# Patient Record
Sex: Female | Born: 1975 | Race: Black or African American | Hispanic: No | Marital: Single | Smoking: Never smoker
Health system: Southern US, Community
[De-identification: ages and names within clinical notes are randomized; demographics above are authoritative.]

## PROBLEM LIST (undated history)

## (undated) DIAGNOSIS — I10 Essential (primary) hypertension: Secondary | ICD-10-CM

---

## 1997-11-04 ENCOUNTER — Emergency Department (HOSPITAL_COMMUNITY): Admission: EM | Admit: 1997-11-04 | Discharge: 1997-11-04 | Payer: Self-pay | Admitting: Emergency Medicine

## 2010-10-19 ENCOUNTER — Emergency Department (INDEPENDENT_AMBULATORY_CARE_PROVIDER_SITE_OTHER): Payer: Self-pay

## 2010-10-19 ENCOUNTER — Emergency Department (HOSPITAL_BASED_OUTPATIENT_CLINIC_OR_DEPARTMENT_OTHER)
Admission: EM | Admit: 2010-10-19 | Discharge: 2010-10-19 | Disposition: A | Discharge status: Home / Self Care | Payer: Self-pay | Attending: Emergency Medicine | Admitting: Emergency Medicine

## 2010-10-19 DIAGNOSIS — I10 Essential (primary) hypertension: Secondary | ICD-10-CM | POA: Insufficient documentation

## 2010-10-19 DIAGNOSIS — R51 Headache: Secondary | ICD-10-CM

## 2010-10-19 DIAGNOSIS — R509 Fever, unspecified: Secondary | ICD-10-CM

## 2010-10-19 LAB — URINALYSIS, ROUTINE W REFLEX MICROSCOPIC
Glucose, UA: NEGATIVE mg/dL
Hgb urine dipstick: NEGATIVE
Ketones, ur: NEGATIVE mg/dL
pH: 5.5 (ref 5.0–8.0)

## 2010-10-19 LAB — BASIC METABOLIC PANEL
CO2: 25 mEq/L (ref 19–32)
Chloride: 104 mEq/L (ref 96–112)
GFR calc Af Amer: >60 mL/min (ref >60)
Potassium: 3.9 mEq/L (ref 3.5–5.1)

## 2011-01-04 ENCOUNTER — Encounter: Payer: Self-pay | Admitting: Emergency Medicine

## 2011-01-04 ENCOUNTER — Emergency Department (HOSPITAL_BASED_OUTPATIENT_CLINIC_OR_DEPARTMENT_OTHER)
Admission: EM | Admit: 2011-01-04 | Discharge: 2011-01-04 | Disposition: A | Discharge status: Home / Self Care | Payer: Self-pay | Attending: Emergency Medicine | Admitting: Emergency Medicine

## 2011-01-04 DIAGNOSIS — R509 Fever, unspecified: Secondary | ICD-10-CM | POA: Insufficient documentation

## 2011-01-04 DIAGNOSIS — J029 Acute pharyngitis, unspecified: Secondary | ICD-10-CM | POA: Insufficient documentation

## 2011-01-04 DIAGNOSIS — J45909 Unspecified asthma, uncomplicated: Secondary | ICD-10-CM | POA: Insufficient documentation

## 2011-01-04 DIAGNOSIS — R52 Pain, unspecified: Secondary | ICD-10-CM | POA: Insufficient documentation

## 2011-01-04 DIAGNOSIS — I10 Essential (primary) hypertension: Secondary | ICD-10-CM | POA: Insufficient documentation

## 2011-01-04 HISTORY — DX: Essential (primary) hypertension: I10

## 2011-01-04 LAB — URINALYSIS, ROUTINE W REFLEX MICROSCOPIC
Leukocytes, UA: NEGATIVE
Nitrite: NEGATIVE
Specific Gravity, Urine: 1.02 (ref 1.005–1.030)
pH: 7 (ref 5.0–8.0)

## 2011-01-04 LAB — PREGNANCY, URINE: Preg Test, Ur: NEGATIVE

## 2011-01-04 LAB — RAPID STREP SCREEN (MED CTR MEBANE ONLY): Streptococcus, Group A Screen (Direct): NEGATIVE

## 2011-01-04 MED ORDER — PENICILLIN G BENZATHINE 1200000 UNIT/2ML IM SUSP
1.2000 10*6.[IU] | Freq: Once | INTRAMUSCULAR | Status: AC
  Administered 2011-01-04: 1.2 10*6.[IU] via INTRAMUSCULAR

## 2011-01-04 MED ORDER — PENICILLIN G BENZATHINE 1200000 UNIT/2ML IM SUSP
INTRAMUSCULAR | Status: AC
  Filled 2011-01-04: qty 2

## 2011-01-04 MED ORDER — KETOROLAC TROMETHAMINE 30 MG/ML IJ SOLN
30.0000 mg | Freq: Once | INTRAMUSCULAR | Status: AC
  Administered 2011-01-04: 30 mg via INTRAMUSCULAR
  Filled 2011-01-04: qty 1

## 2011-01-04 MED ORDER — IBUPROFEN 800 MG PO TABS: 800.0000 mg | ORAL_TABLET | Freq: Three times a day (TID) | ORAL | Status: AC

## 2011-01-04 MED ORDER — OXYCODONE-ACETAMINOPHEN 5-325 MG PO TABS: 1.0000 | ORAL_TABLET | Freq: Once | ORAL | Status: DC

## 2011-01-04 NOTE — ED Notes (Signed)
Pt c/o flu-like symptoms including sore throat, chills, body aches.

## 2011-01-04 NOTE — ED Provider Notes (Signed)
History     Chief Complaint  Patient presents with  . Sore Throat  . Generalized Body Aches  . Chills   HPI Pt presents with c/o sore throat, diffuse body aches and low grade fever.  Symptoms began 2 days ago.  Has tried OTC cold medications without much relief.  No dificulty breathing or swallowing. No cough, no nasal congestion History of Present Illness   Patient Identification Jamie Meyer is a 35 y.o. female.  Patient information was obtained from patient. History/Exam limitations: none. Patient presented to the Emergency Department ambulatory.  Chief Complaint  Sore Throat, Generalized Body Aches and Chills   The patient complains of sore throat. Onset of symptoms was gradual starting a few days ago and has been gradually worsening since that time. The patient has not been seen by another provider for this illness. Other symptoms include no nasal congestion, swollen glands, fever or cough or swollen glands. The patient has not had a known Strep exposure. The patient has not had a known Mono exposure. Care prior to arrival consisted of nothing, with no relief.  Past Medical History  Diagnosis Date  . Asthma   . Hypertension    No family history on file. No current facility-administered medications for this encounter.   Current Outpatient Prescriptions  Medication Sig Dispense Refill  . hydrochlorothiazide 25 MG tablet Take by mouth daily.        . nebivolol (BYSTOLIC) 10 MG tablet Take by mouth daily.        Marland Kitchen ibuprofen (ADVIL,MOTRIN) 800 MG tablet Take 1 tablet (800 mg total) by mouth 3 (three) times daily.  21 tablet  0   Allergies  Allergen Reactions  . Sulfa Drugs Cross Reactors    History   Social History  . Marital Status: Single    Spouse Name: N/A    Number of Children: N/A  . Years of Education: N/A   Occupational History  . Not on file.   Social History Main Topics  . Smoking status: Never Smoker   . Smokeless tobacco: Not on file  . Alcohol  Use: Yes  . Drug Use: No  . Sexually Active:    Other Topics Concern  . Not on file   Social History Narrative  . No narrative on file   Review of Systems Pertinent items are noted in HPI.  10 Systems reviewed and are negative for acute change except as noted in the HPI. Physical Exam   BP 140/89  Pulse 89  Temp(Src) 99.8 F (37.7 C) (Oral)  Resp 18  SpO2 99%  LMP 12/21/2010 BP 140/89  Pulse 89  Temp(Src) 99.8 F (37.7 C) (Oral)  Resp 18  SpO2 99%  LMP 12/21/2010 General appearance: alert and cooperative Head: Normocephalic, without obvious abnormality, atraumatic Nose: Nares normal. Septum midline. Mucosa normal. No drainage or sinus tenderness. Throat: lips, mucosa, and tongue normal; teeth and gums normal, OP with moderate erythema,  Lungs: clear to auscultation bilaterally Heart: regular rate and rhythm, S1, S2 normal, no murmur, click, rub or gallop Abdomen: soft, non-tender; bowel sounds normal; no masses,  no organomegaly Extremities: extremities normal, atraumatic, no cyanosis or edema Pulses: 2+ and symmetric Skin: Skin color, texture, turgor normal. No rashes or lesions Lymph nodes: mild anterior cervical lymphadenopathy  ED Course   Studies: Lab: rapid strep screen negative  Records Reviewed: Nursing notes.  Treatments: Antibiotics given.  Disposition: Home Nonsteroidals, pt treated with bicillin IM due to high centor criteria  Past Medical  History  Diagnosis Date  . Asthma   . Hypertension     No past surgical history on file.  No family history on file.  History  Substance Use Topics  . Smoking status: Never Smoker   . Smokeless tobacco: Not on file  . Alcohol Use: Yes    OB History    Grav Para Term Preterm Abortions TAB SAB Ect Mult Living                  Review of SystemsROS reviewed and otherwise negative except for mentioned in HPI   Physical Exam  BP 144/96  Pulse 103  Temp(Src) 99.8 F (37.7 C) (Oral)  Resp 18   SpO2 100%  LMP 12/21/2010  Physical Exam  Vitals reviewed.   ED Course  Procedures Pt with rapid strep negative, however does have centor criteria of 3, will treat with bicillin.  Pt agreeable with this plan.   MDM Fever, myalgias, pharyngitis.  Pt treated for strep pharyngitis, and symptomatic care.

## 2013-01-28 IMAGING — CT CT HEAD W/O CM
1 series · 16 of 30 positions shown, 20 images · non-contrast
Comparison: None.

CLINICAL DATA: Pain, headache, fever

CT HEAD WITHOUT CONTRAST
TECHNIQUE: Contiguous axial images were obtained from the base of
the skull through the vertex without contrast.

[Series 2: head 4.8 h37s · axial · 0.45mm/px · z∈[+1239,+1386]mm · 16 of 35 slices shown, 20 images]
[im 2/35  brain]
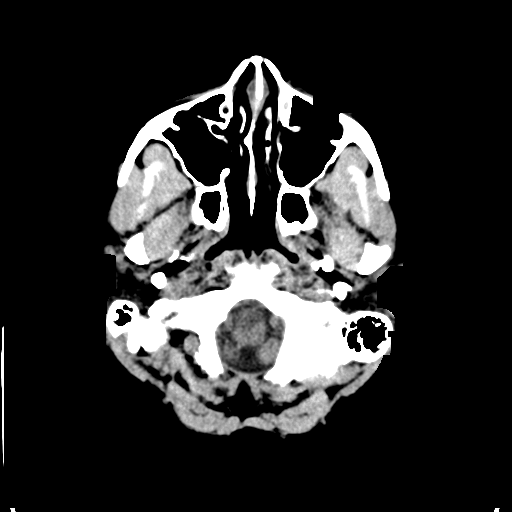
[im 2/35  bone]
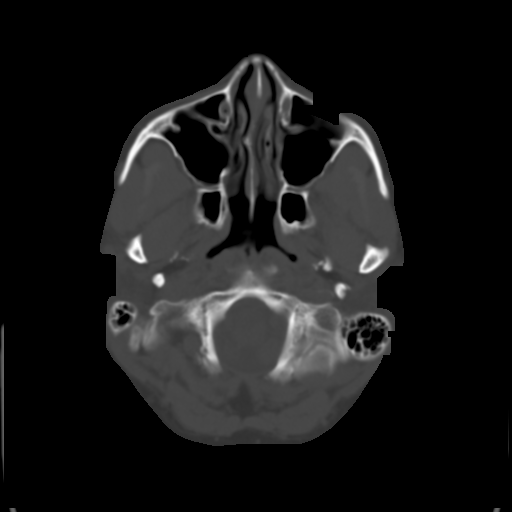
[im 4/35  brain]
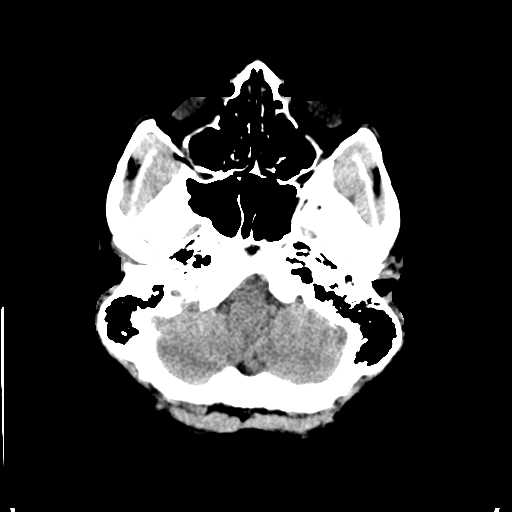
[im 6/35  brain]
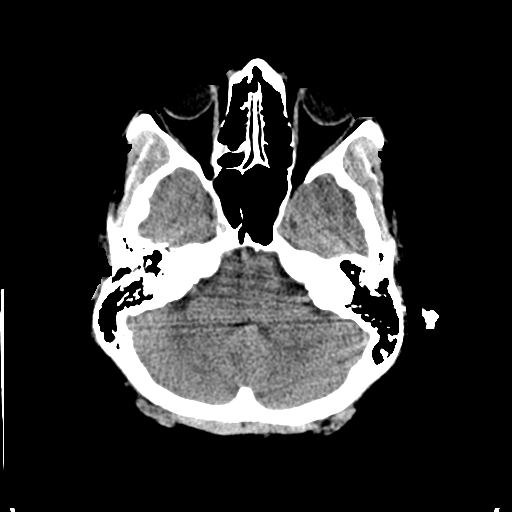
[im 9/35  brain]
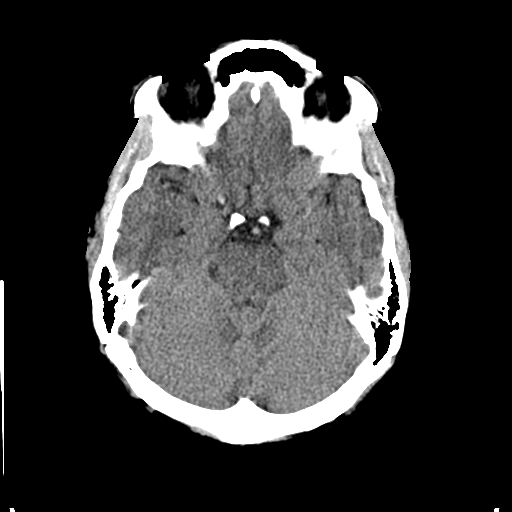
[im 10/35  brain]
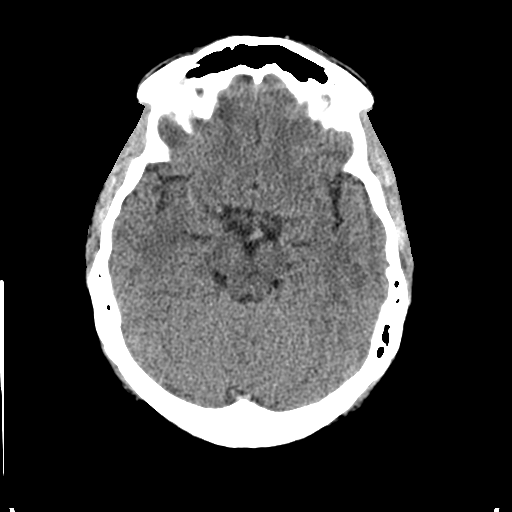
[im 10/35  bone]
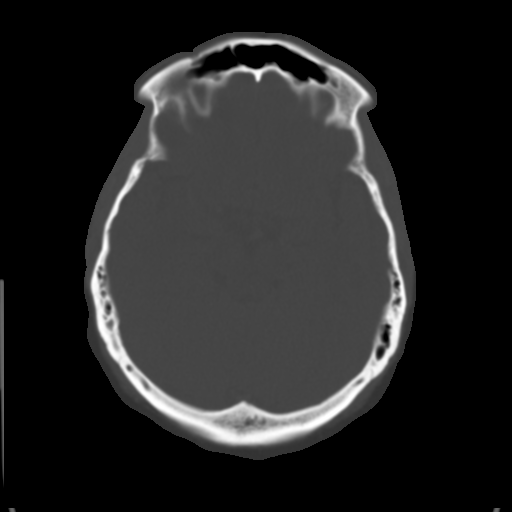
[im 12/35  brain]
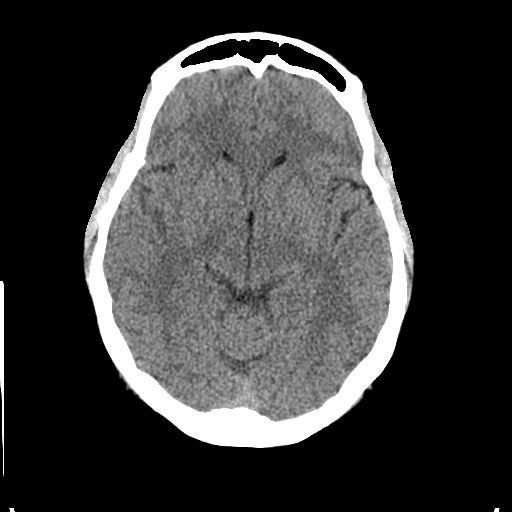
[im 15/35  brain]
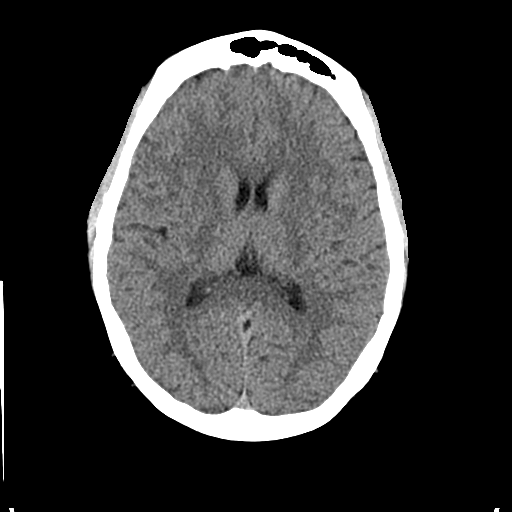
[im 17/35  brain]
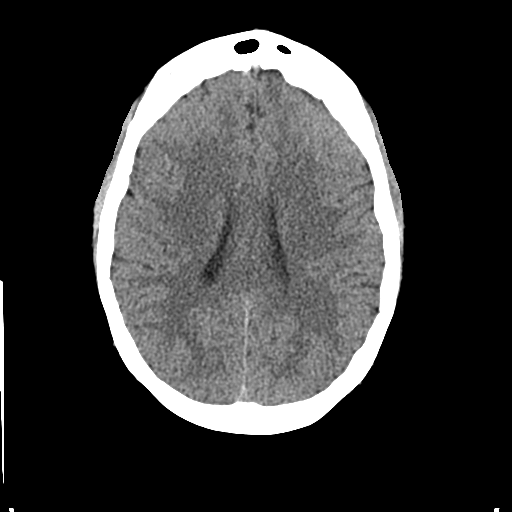
[im 18/35  brain]
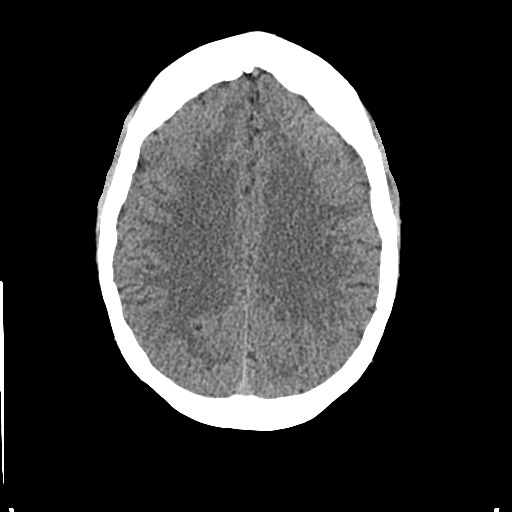
[im 18/35  bone]
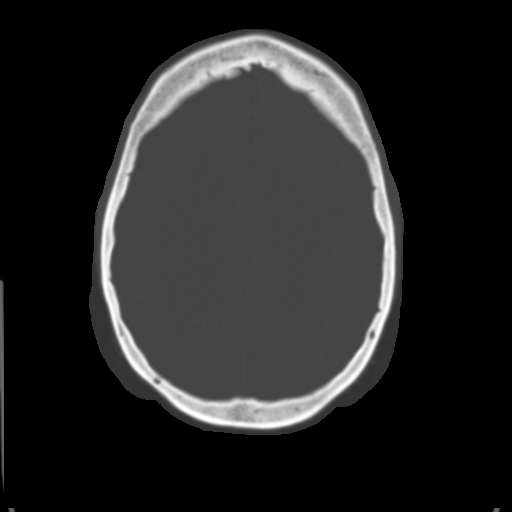
[im 20/35  brain]
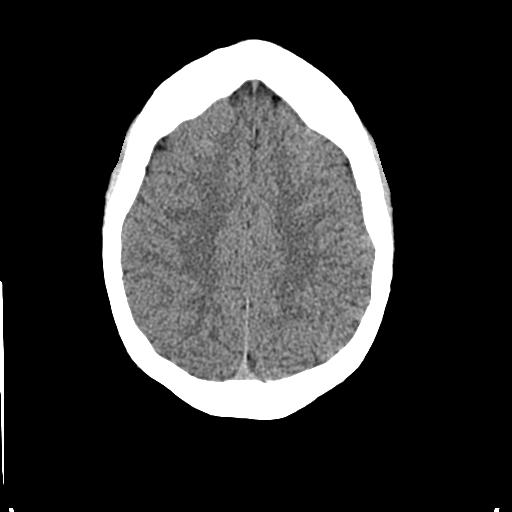
[im 23/35  brain]
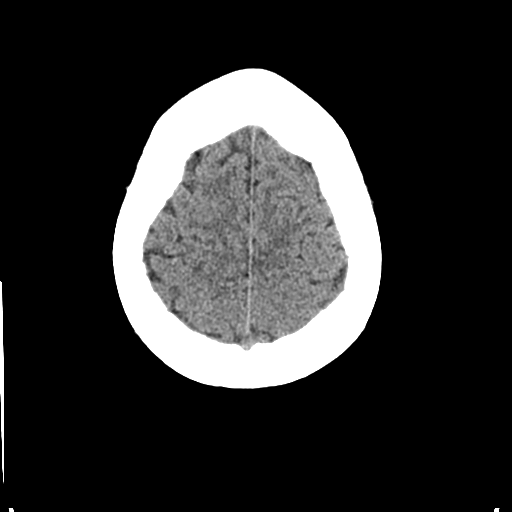
[im 25/35  brain]
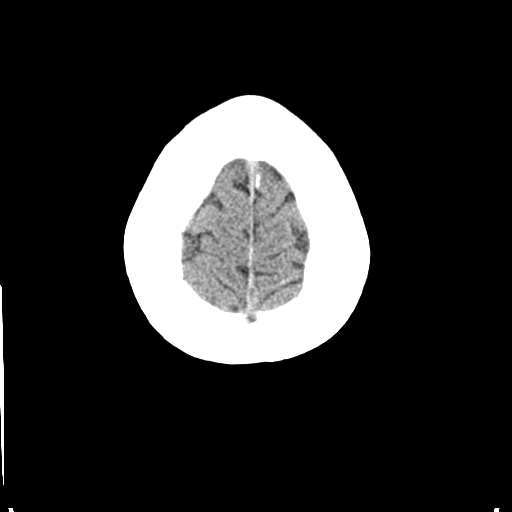
[im 26/35  brain]
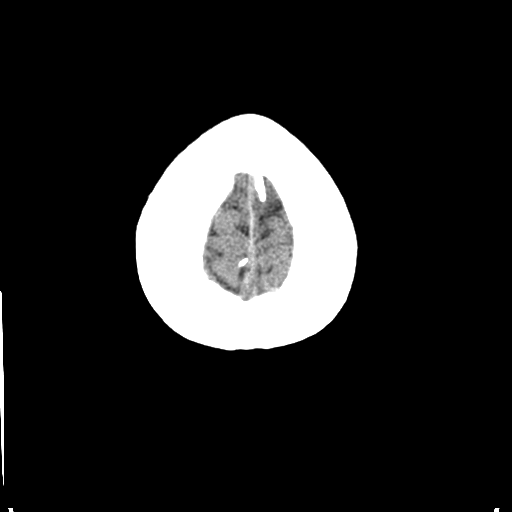
[im 26/35  bone]
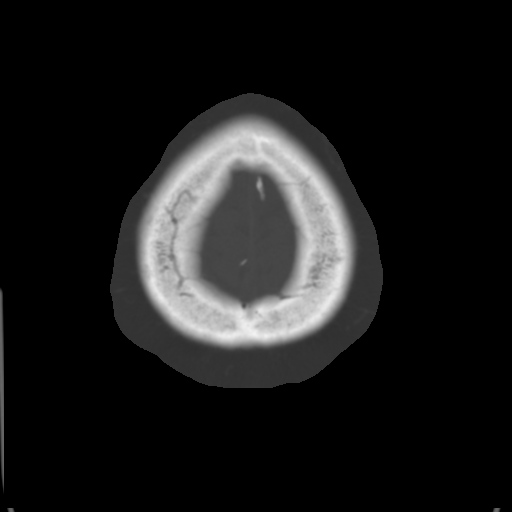
[im 29/35  brain]
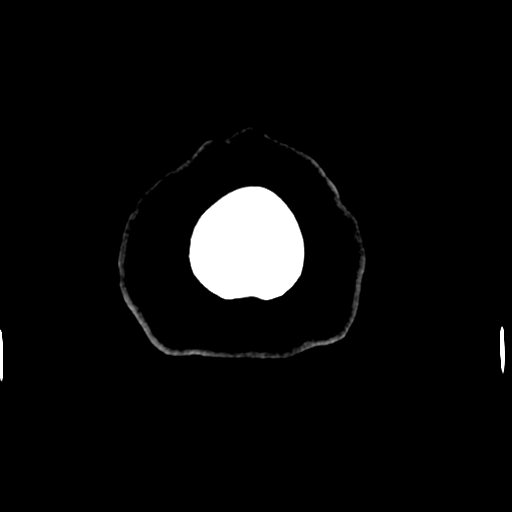
[im 31/35  brain]
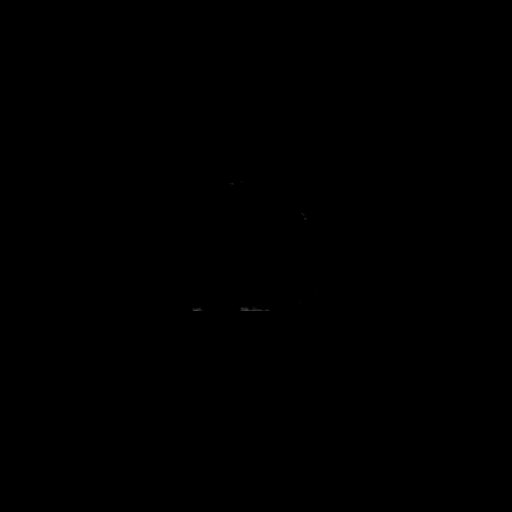
[im 33/35  brain]
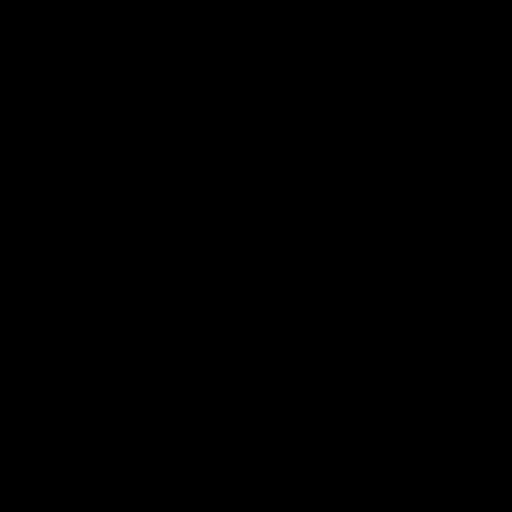

[16 of 30 positions shown; findings below may reference images not displayed]

FINDINGS: No skull fracture is noted.  Paranasal sinuses and
mastoid air cells are unremarkable.

No intracranial hemorrhage, mass effect or midline shift.

No acute infarction.  The gray and white matter differentiation is
preserved.  No mass lesion is noted on this unenhanced scan.  No
hydrocephalus.  No intra or extra-axial fluid collection.
IMPRESSION: No acute intracranial abnormality.

## 2013-12-22 ENCOUNTER — Other Ambulatory Visit: Payer: Self-pay | Admitting: *Deleted

## 2013-12-22 DIAGNOSIS — D259 Leiomyoma of uterus, unspecified: Secondary | ICD-10-CM

## 2013-12-23 ENCOUNTER — Telehealth: Payer: Self-pay | Admitting: *Deleted

## 2013-12-23 ENCOUNTER — Encounter: Payer: Self-pay | Admitting: *Deleted

## 2013-12-23 NOTE — Telephone Encounter (Signed)
Attempted to contact patient, no answer, left message with appointment date/time and special instructions. Will send letter.  Letter sent.

## 2014-01-01 ENCOUNTER — Ambulatory Visit (HOSPITAL_COMMUNITY)
Admission: RE | Admit: 2014-01-01 | Discharge: 2014-01-01 | Disposition: A | Discharge status: Home / Self Care | Payer: 59 | Source: Ambulatory Visit | Attending: Obstetrics & Gynecology | Admitting: Obstetrics & Gynecology

## 2014-01-01 DIAGNOSIS — D259 Leiomyoma of uterus, unspecified: Secondary | ICD-10-CM | POA: Insufficient documentation

## 2014-01-01 DIAGNOSIS — N852 Hypertrophy of uterus: Secondary | ICD-10-CM | POA: Insufficient documentation

## 2014-03-03 ENCOUNTER — Encounter: Payer: Self-pay | Admitting: *Deleted

## 2014-03-22 ENCOUNTER — Encounter: Payer: 59 | Admitting: Obstetrics & Gynecology

## 2014-04-30 ENCOUNTER — Encounter: Payer: 59 | Admitting: Obstetrics & Gynecology

## 2016-04-12 IMAGING — US US TRANSVAGINAL NON-OB
1 series · 13 of 25 positions shown · non-contrast
Comparison: None

CLINICAL DATA: Uterine leiomyoma

EXAM:
TRANSABDOMINAL AND TRANSVAGINAL ULTRASOUND OF PELVIS
TECHNIQUE: Both transabdominal and transvaginal ultrasound examinations of the
pelvis were performed. Transabdominal technique was performed for
global imaging of the pelvis including uterus, ovaries, adnexal
regions, and pelvic cul-de-sac. It was necessary to proceed with
endovaginal exam following the transabdominal exam to visualize the
endometrial complex.

[Series 1: us pelvis complete · 13 of 46 slices shown]
[im 1/46]
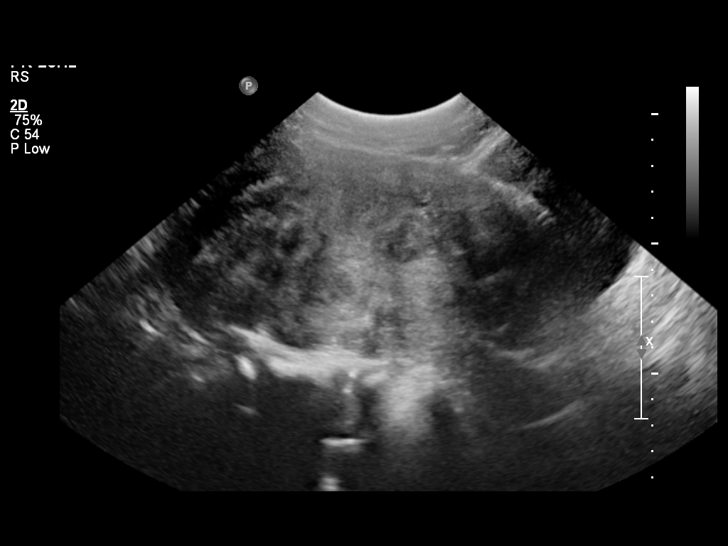
[im 4/46]
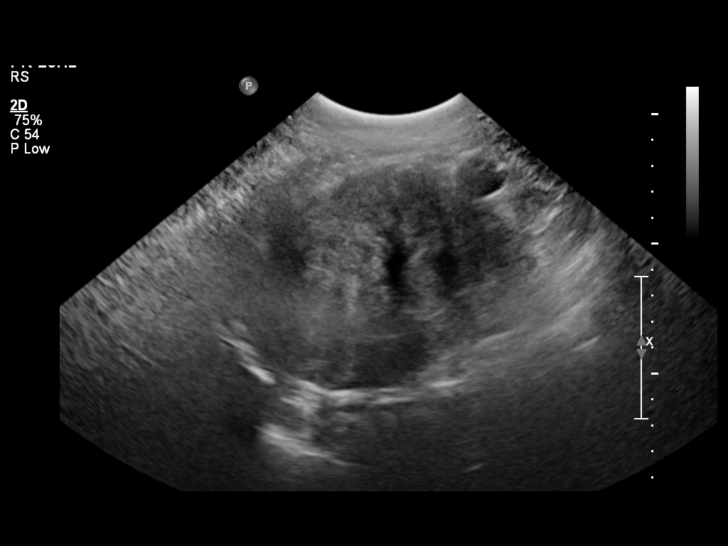
[im 8/46]
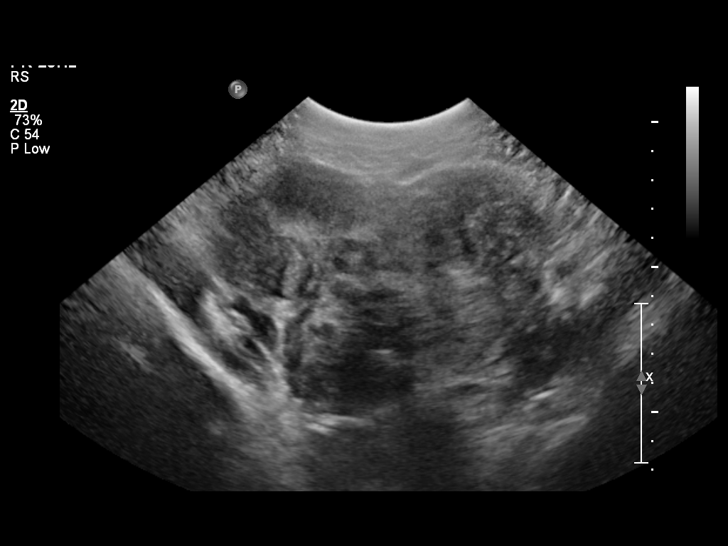
[im 12/46]
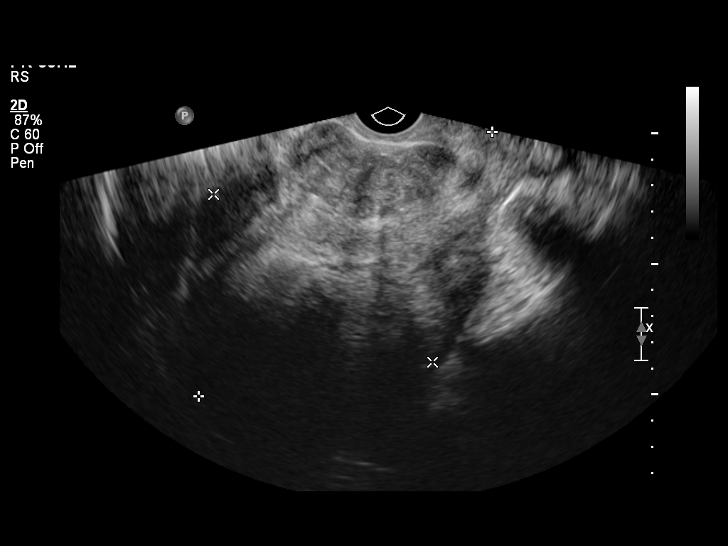
[im 16/46]
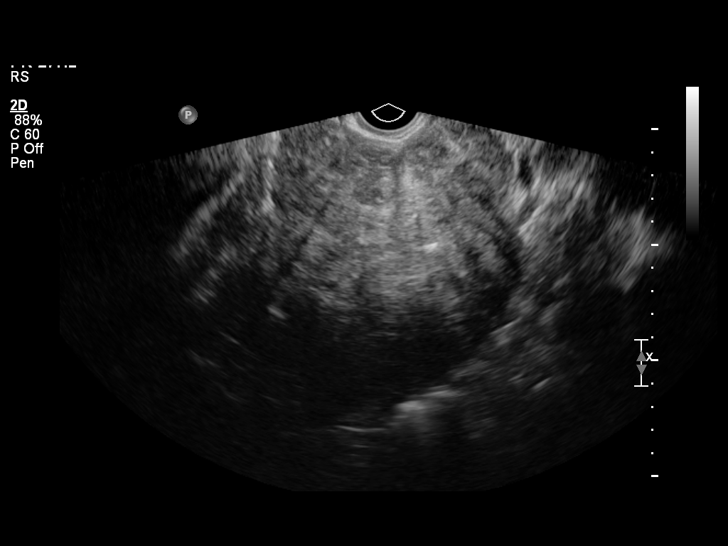
[im 19/46]
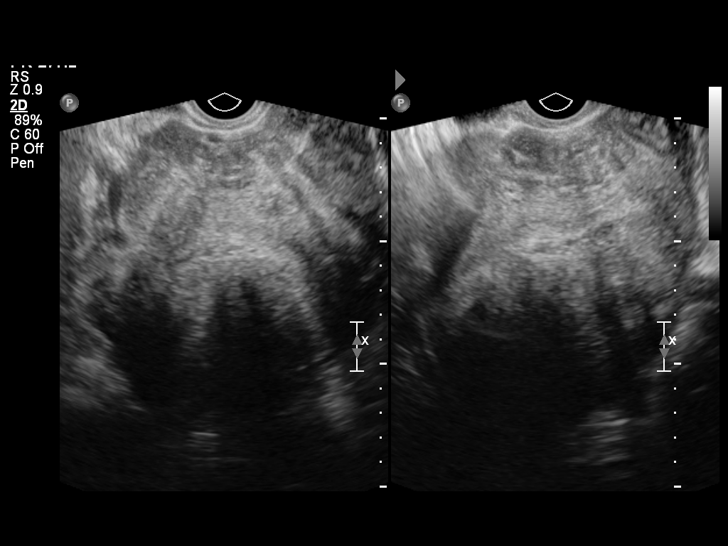
[im 23/46]
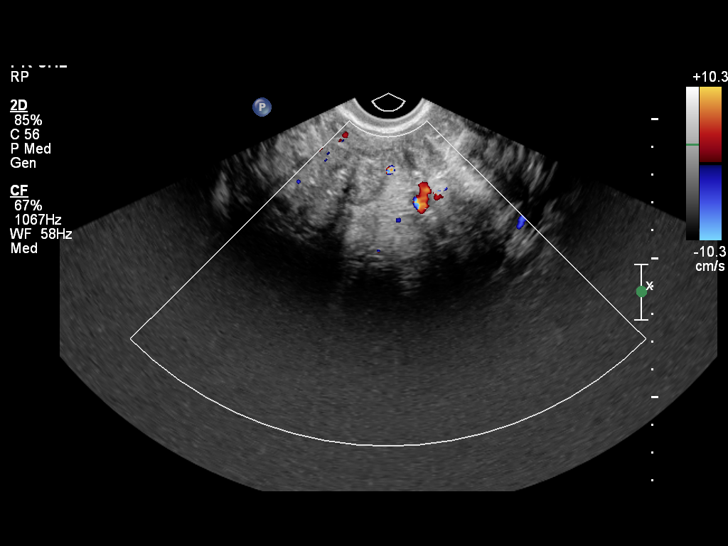
[im 27/46]
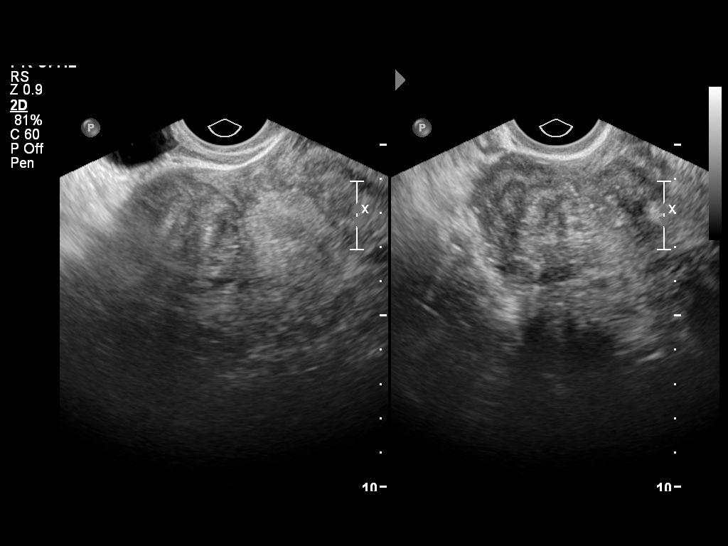
[im 31/46]
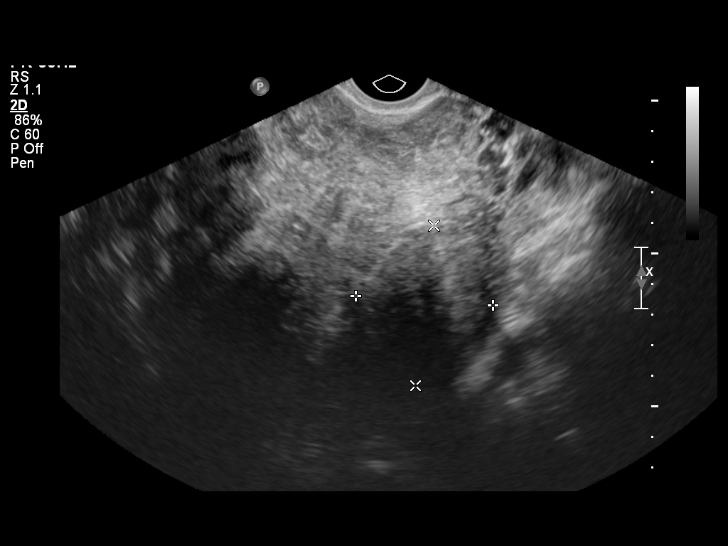
[im 34/46]
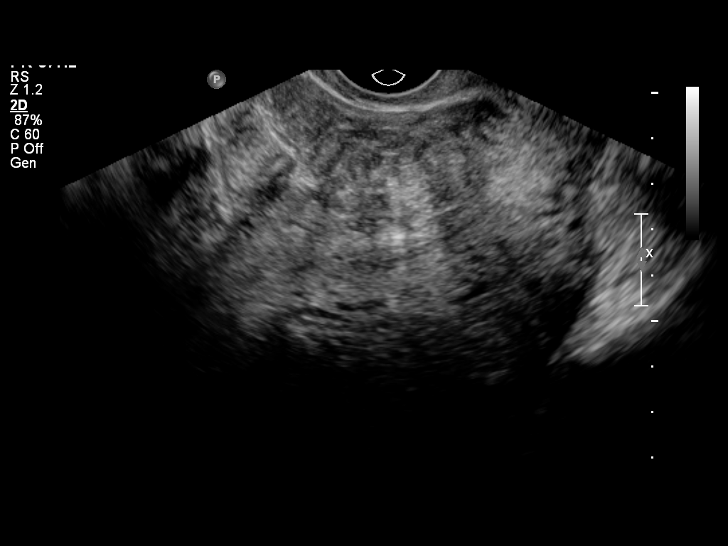
[im 38/46]
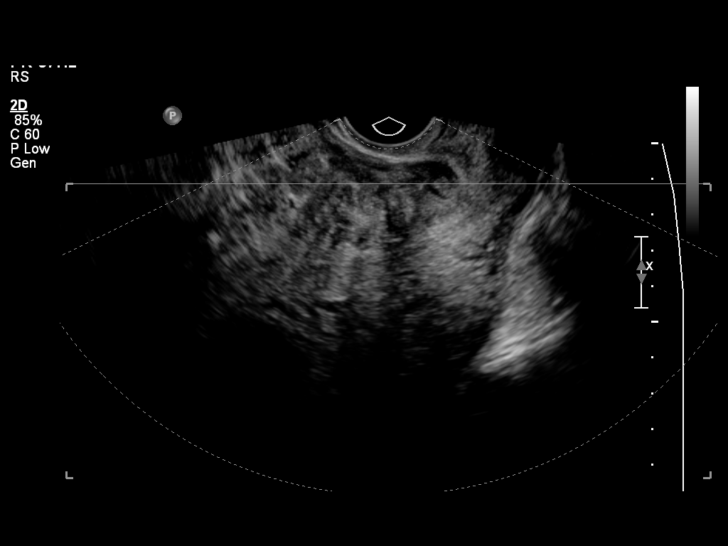
[im 42/46]
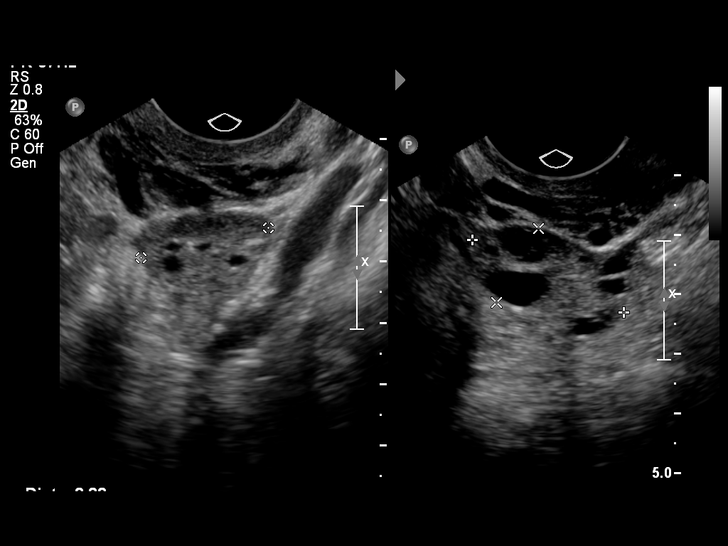
[im 46/46]
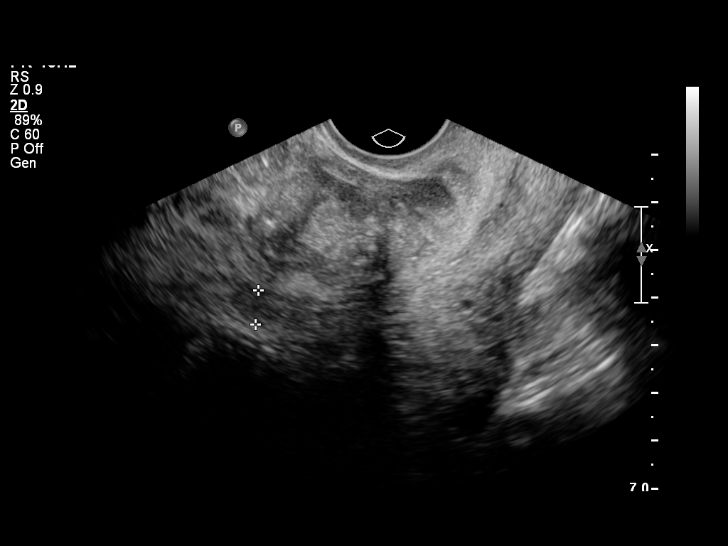

[13 of 25 positions shown; findings below may reference images not displayed]

FINDINGS: Uterus

Measurements: 15.1 x 10.6 x 11.6 cm. Enlarged and heterogeneous with
numerous nodular foci compatible with leiomyomata. Discretely
measurable leiomyoma include an 8.0 x 7.3 x 8.1 cm diameter mass at
the LEFT fundus, an anterior RIGHT leiomyoma 3.6 x 3.5 x 3.8 cm, and
a posterior LEFT leiomyoma 4.5 x 5.3 x 3.9 cm.

Endometrium

Poorly delineated and unable to accurately measure due to
significant distortion by numerous leiomyomata. No obvious
endometrial fluid.

Right ovary

Measurements: 2.8 x 3.7 x 2.5 cm. Normal morphology without mass.

Left ovary

Measurements: 2.8 x 1.4 x 2.1 cm. Normal morphology without mass.

Other findings:  No free fluid or adnexal masses.
IMPRESSION: Enlarged uterus containing multiple leiomyomata, largest discrete
ones measured as above.

Endometrial complex is distorted and poorly defined due to the
numerous leiomyomata present, unable to adequately delineate or
measure.

Unremarkable ovaries.

## 2017-02-12 ENCOUNTER — Other Ambulatory Visit: Payer: Self-pay | Admitting: Gastroenterology

## 2017-02-12 DIAGNOSIS — R112 Nausea with vomiting, unspecified: Secondary | ICD-10-CM

## 2017-02-12 DIAGNOSIS — R1011 Right upper quadrant pain: Secondary | ICD-10-CM

## 2017-02-12 NOTE — Progress Notes (Signed)
Jamie Stach MD 

## 2017-03-04 ENCOUNTER — Encounter (HOSPITAL_COMMUNITY)
Admission: RE | Admit: 2017-03-04 | Discharge: 2017-03-04 | Disposition: A | Discharge status: Home / Self Care | Payer: 59 | Source: Ambulatory Visit | Attending: Gastroenterology | Admitting: Gastroenterology

## 2017-03-04 ENCOUNTER — Encounter (HOSPITAL_COMMUNITY): Payer: Self-pay | Admitting: Radiology

## 2017-03-04 DIAGNOSIS — R112 Nausea with vomiting, unspecified: Secondary | ICD-10-CM | POA: Diagnosis not present

## 2017-03-04 DIAGNOSIS — R1011 Right upper quadrant pain: Secondary | ICD-10-CM | POA: Insufficient documentation

## 2017-03-04 MED ORDER — TECHNETIUM TC 99M SULFUR COLLOID
2.2000 | Freq: Once | INTRAVENOUS | Status: AC | PRN
  Administered 2017-03-04: 2.2 via ORAL

## 2017-03-05 ENCOUNTER — Encounter (HOSPITAL_COMMUNITY)
Admission: RE | Admit: 2017-03-05 | Discharge: 2017-03-05 | Disposition: A | Discharge status: Home / Self Care | Payer: 59 | Source: Ambulatory Visit | Attending: Gastroenterology | Admitting: Gastroenterology

## 2017-03-05 DIAGNOSIS — R1011 Right upper quadrant pain: Secondary | ICD-10-CM | POA: Insufficient documentation

## 2017-03-05 DIAGNOSIS — R112 Nausea with vomiting, unspecified: Secondary | ICD-10-CM

## 2017-03-05 MED ORDER — TECHNETIUM TC 99M MEBROFENIN IV KIT
5.0000 | PACK | Freq: Once | INTRAVENOUS | Status: AC | PRN
  Administered 2017-03-05: 5 via INTRAVENOUS

## 2018-10-27 IMAGING — US US ABDOMEN COMPLETE
1 series · 14 of 25 positions shown · non-contrast
Comparison: No prior right upper quadrant imaging.

CLINICAL DATA: 41-year-old female with nausea and frequent
vomiting. Chronic right upper quadrant abdominal pain.

EXAM:
ABDOMEN ULTRASOUND COMPLETE

[Series 1: us abdomen complete · 0.19mm/px · 14 of 64 slices shown]
[im 1/64]
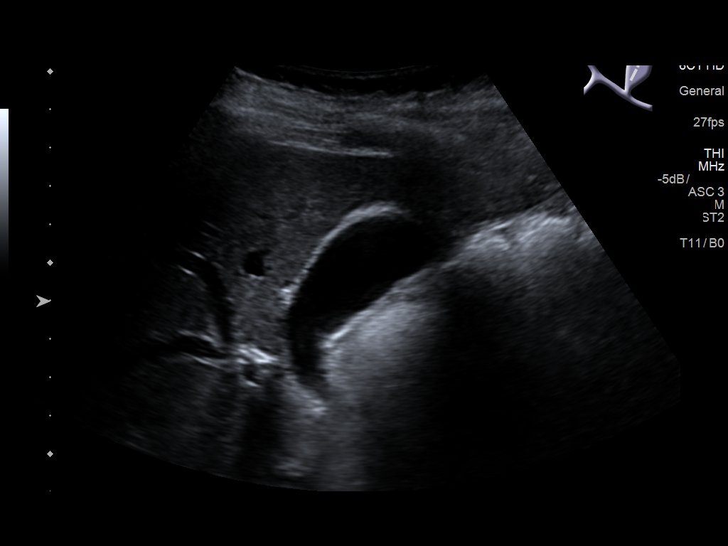
[im 6/64]
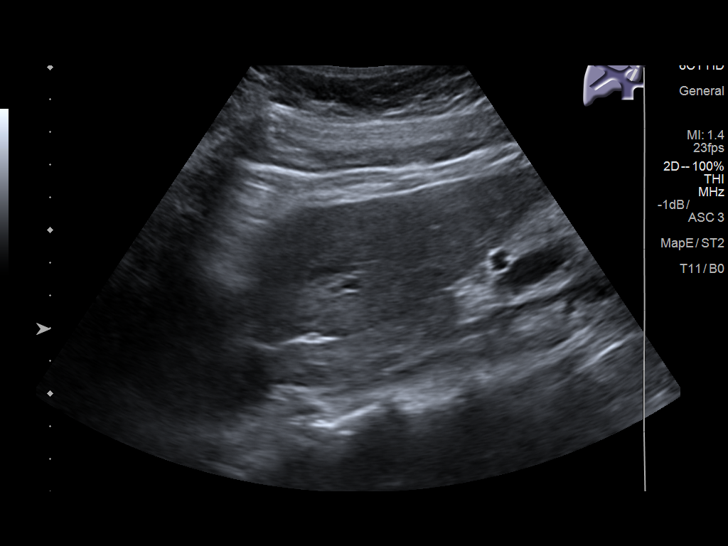
[im 11/64]
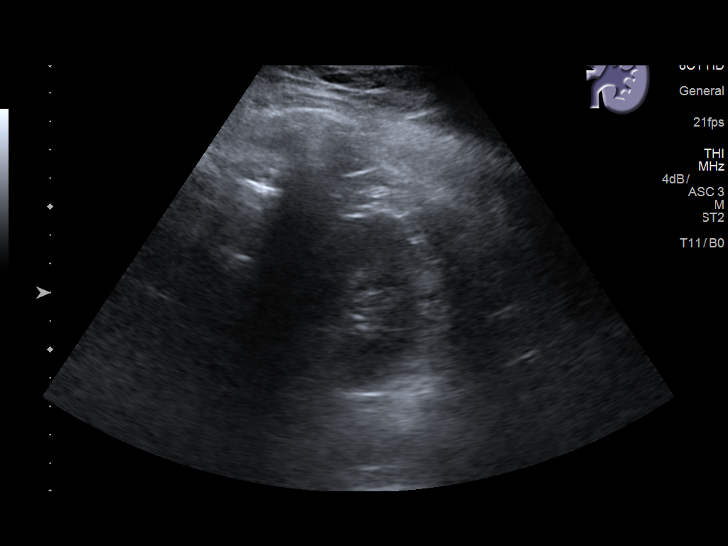
[im 16/64]
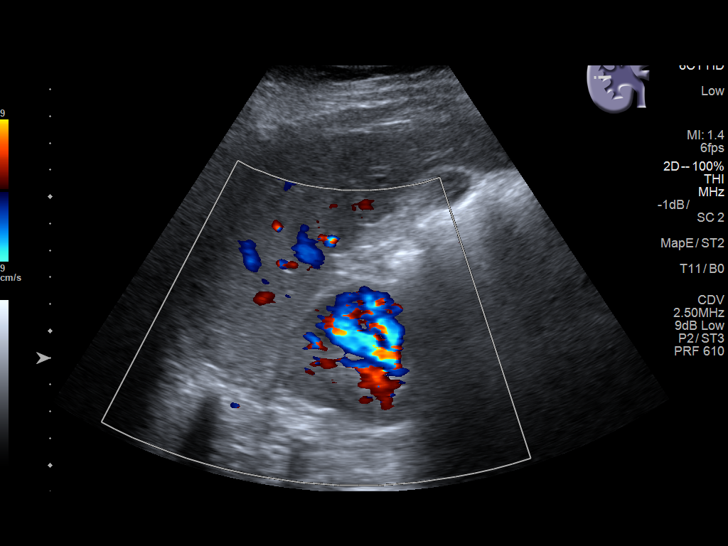
[im 22/64]
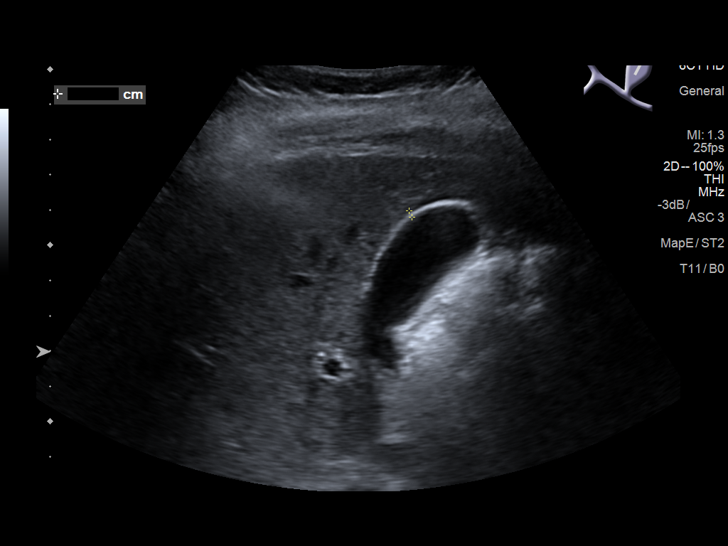
[im 24/64]
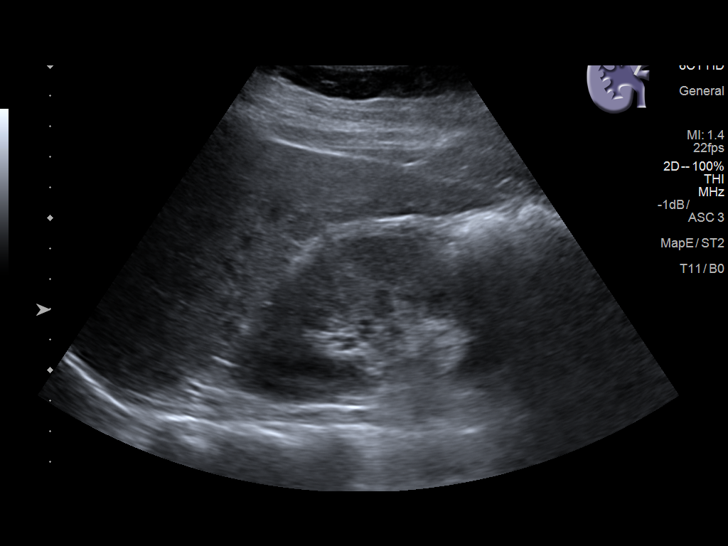
[im 29/64]
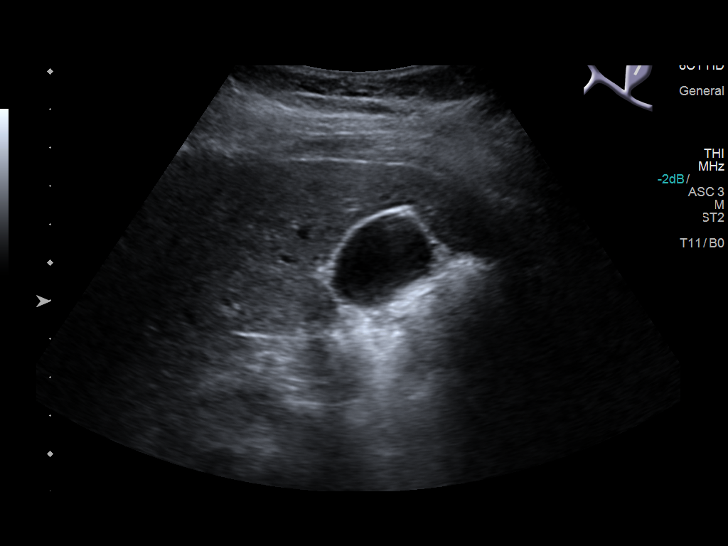
[im 35/64]
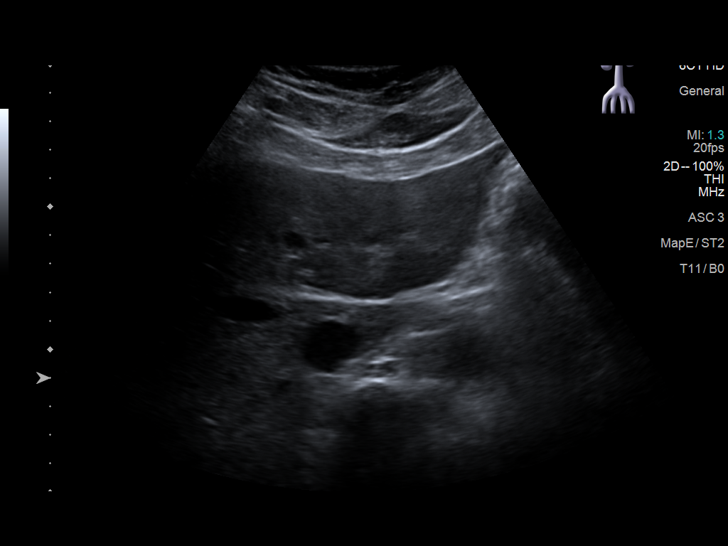
[im 40/64]
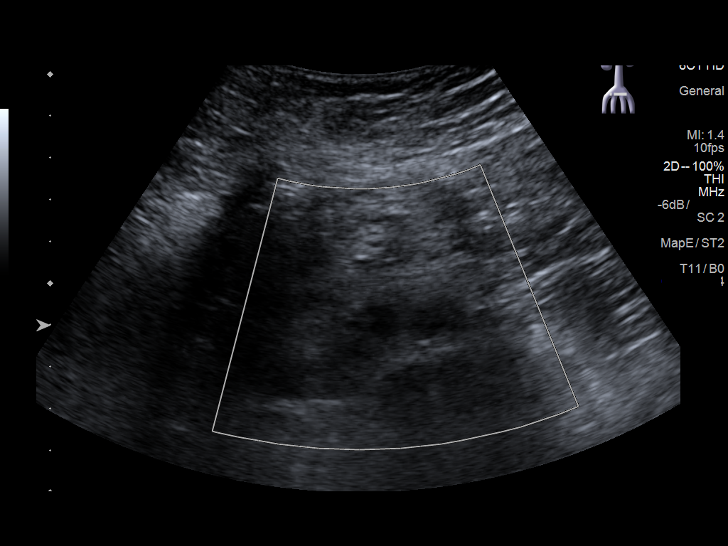
[im 43/64]
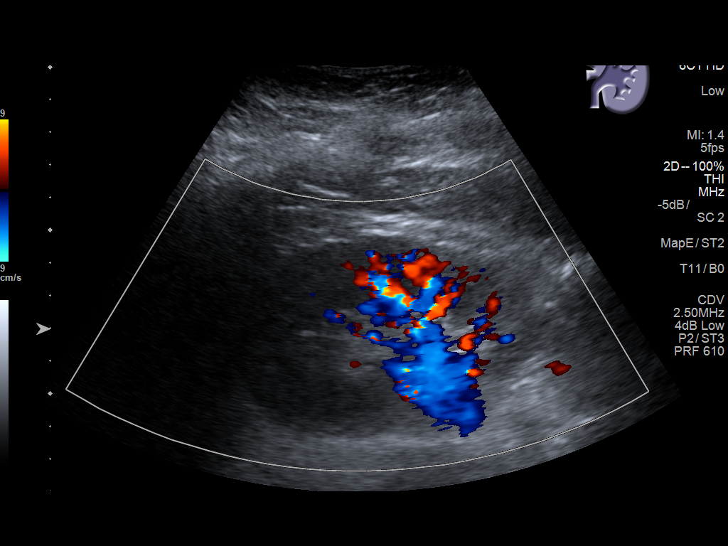
[im 48/64]
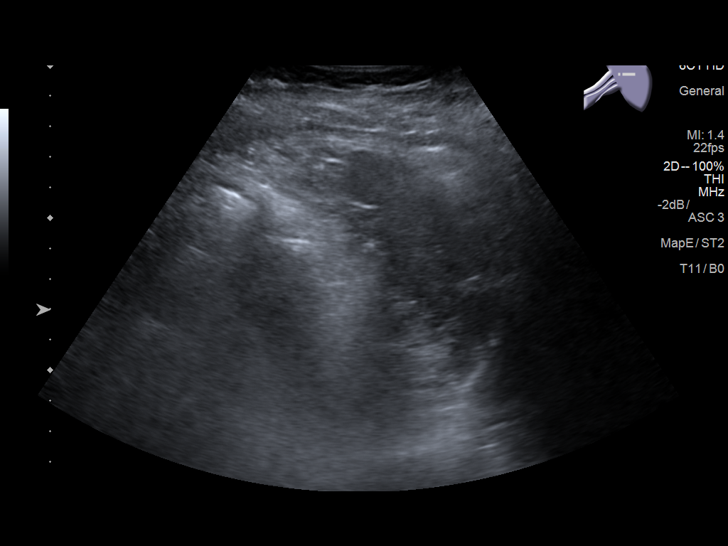
[im 53/64]
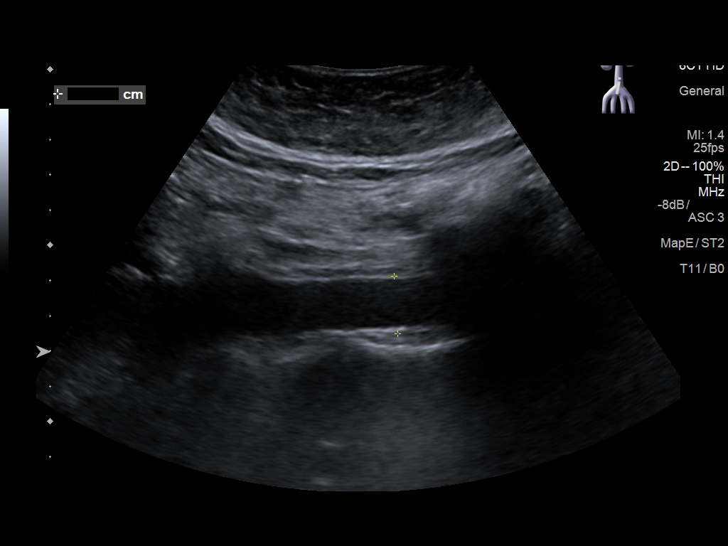
[im 58/64]
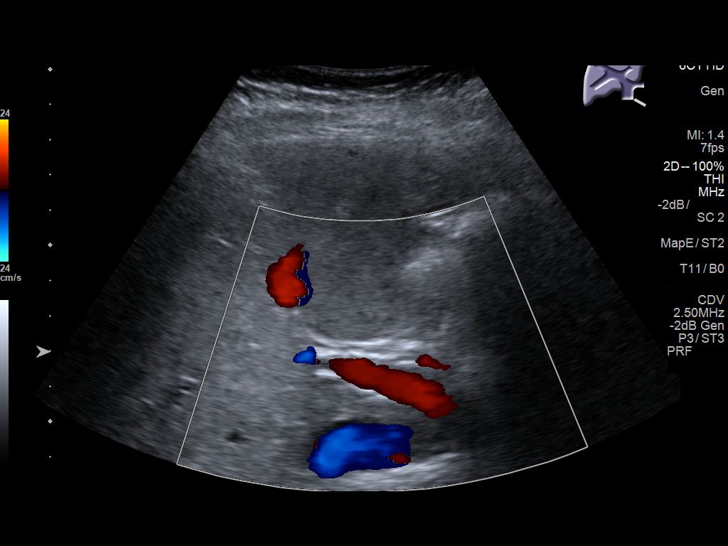
[im 64/64]
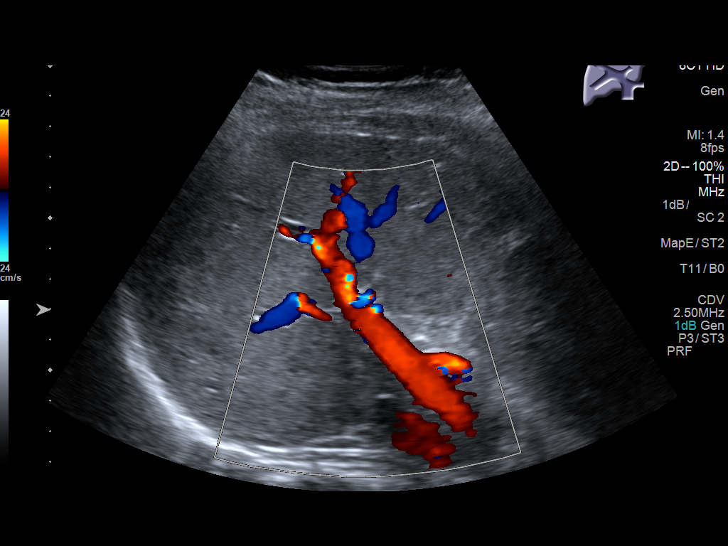

[14 of 25 positions shown; findings below may reference images not displayed]

FINDINGS: Gallbladder: No gallstones or wall thickening visualized. No
sonographic Murphy sign noted by sonographer.

Common bile duct: Diameter: 3 mm , normal.

Liver: No focal lesion identified. Within normal limits in
parenchymal echogenicity. Portal vein is patent on color Doppler
imaging with normal direction of blood flow towards the liver (image
[id number]).

IVC: No abnormality visualized.

Pancreas: Visualized portion unremarkable.

Spleen: Size and appearance within normal limits.

Right Kidney: Length: 10.7 cm. Echogenicity within normal limits. No
mass or hydronephrosis visualized.

Left Kidney: Length: 11.6 cm. Echogenicity within normal limits. No
mass or hydronephrosis visualized.

Abdominal aorta: No aneurysm visualized.

Other findings: None.
IMPRESSION: Normal gallbladder, and normal ultrasound appearance of the abdomen.
